# Patient Record
Sex: Male | Born: 1985 | Race: White | Hispanic: No | Marital: Single | Smoking: Current every day smoker
Health system: Southern US, Community
[De-identification: ages and names within clinical notes are randomized; demographics above are authoritative.]

---

## 2006-01-18 ENCOUNTER — Emergency Department (HOSPITAL_COMMUNITY): Admission: EM | Admit: 2006-01-18 | Discharge: 2006-01-18 | Payer: Self-pay | Admitting: Emergency Medicine

## 2007-01-22 IMAGING — CR DG PELVIS 1-2V
1 series · 1 of 1 positions shown · non-contrast
Comparison: none

CLINICAL DATA: 19 year-old who fell on right hip.
 AP PELVIS, ONE VIEW:

[t pelvis a.p.]
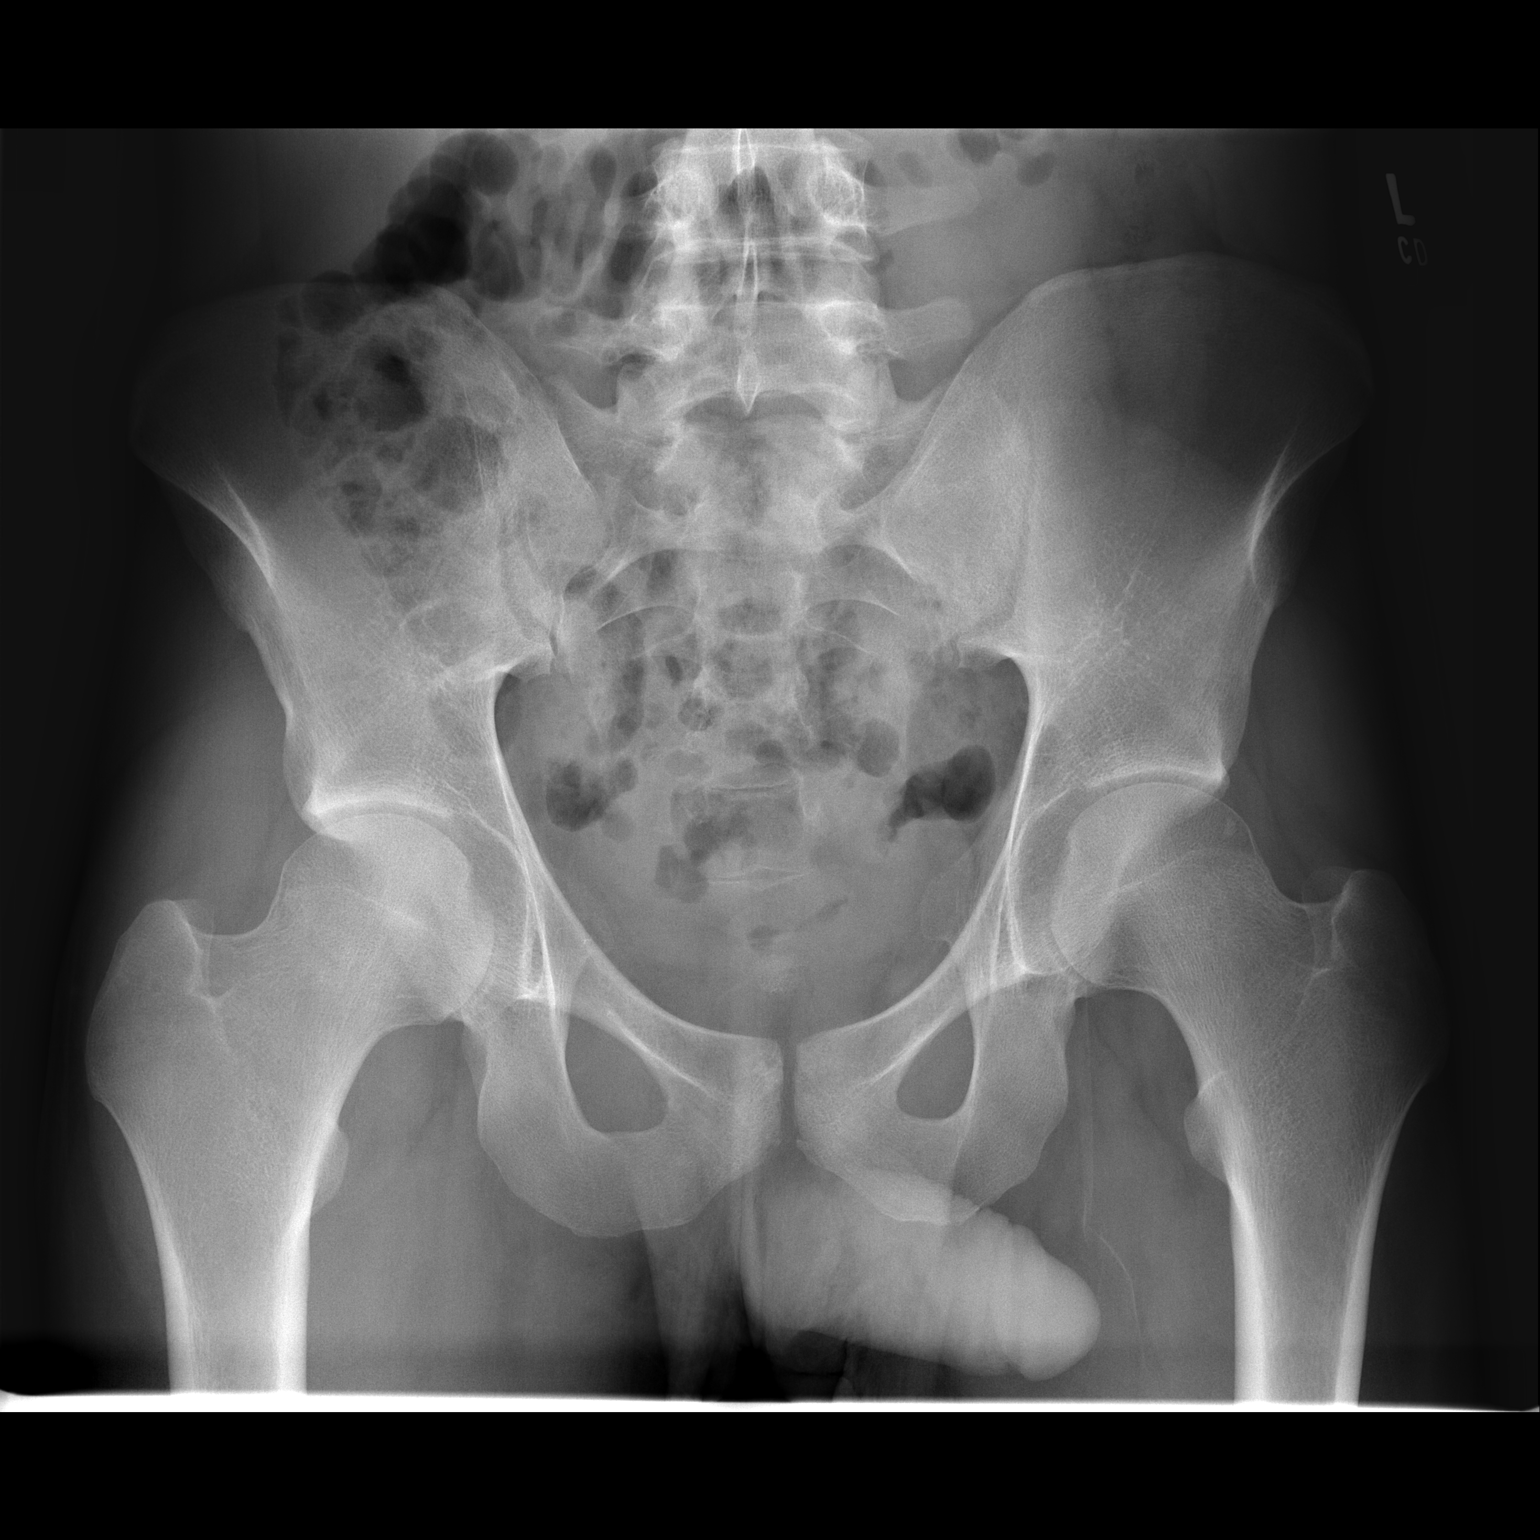

[1 of 1 positions shown; findings below may reference images not displayed]

FINDINGS: A single AP view of the pelvis demonstrates both hips to be located.  No fractures are seen.  Pubic symphysis and SI joints are intact.  No pubic rami fractures are seen.  Sacrum appears normal.
IMPRESSION: No acute bony findings.
 RIGHT HIP ? 2 VIEW:
FINDINGS: Hip joint spaces maintained.  No fractures are seen.
IMPRESSION: No acute bony findings.

## 2009-05-07 ENCOUNTER — Emergency Department (HOSPITAL_COMMUNITY): Admission: EM | Admit: 2009-05-07 | Discharge: 2009-05-07 | Payer: Self-pay | Admitting: Emergency Medicine

## 2019-01-23 ENCOUNTER — Encounter (HOSPITAL_COMMUNITY): Payer: Self-pay | Admitting: Emergency Medicine

## 2019-01-23 ENCOUNTER — Ambulatory Visit (HOSPITAL_COMMUNITY)
Admission: EM | Admit: 2019-01-23 | Discharge: 2019-01-23 | Disposition: A | Discharge status: Home / Self Care | Payer: Self-pay | Attending: Family Medicine | Admitting: Family Medicine

## 2019-01-23 DIAGNOSIS — J020 Streptococcal pharyngitis: Secondary | ICD-10-CM

## 2019-01-23 LAB — POCT RAPID STREP A: Streptococcus, Group A Screen (Direct): POSITIVE — AB

## 2019-01-23 MED ORDER — AMOXICILLIN 500 MG PO CAPS: 1000.0000 mg | ORAL_CAPSULE | Freq: Every day | ORAL | 0 refills | Status: AC

## 2019-01-23 NOTE — ED Triage Notes (Signed)
PT C/O: cold like sx onset yest associated w/fever, sore throat, dysphagia  DENIES: v/n/d  TAKING MEDS: OTC chloraseptic spray   A&O x4... NAD... Ambulatory

## 2019-01-23 NOTE — Discharge Instructions (Addendum)
Strep test was positive Treating with amoxicillin Tylenol or ibuprofen for the pain.  Follow up as needed for continued or worsening symptoms

## 2019-01-24 NOTE — ED Provider Notes (Signed)
[facility]    CSN: [id number] Arrival date & time: 01/23/19  1438     History   Chief Complaint Chief Complaint  Patient presents with  . URI    HPI Leonard Adkins is a 33 y.o. male.   Pt is a 33 year old male that presents with chief complaint of sore throat.  Symptoms started with sudden onset yesterday to include severe sore throat, fever, body aches, trouble swallowing, loss of appetite.  Symptoms have been constant and remain the same.  He has been using over-the-counter Chloraseptic spray without much relief of symptoms.  Denies any cough, congestion, rhinorrhea.  Denies any recent sick contacts.  Denies any recent traveling.  ROS per HPI      History reviewed. No pertinent past medical history.  There are no active problems to display for this patient.   History reviewed. No pertinent surgical history.     Home Medications    Prior to Admission medications   Medication Sig Start Date End Date Taking? Authorizing Provider  amoxicillin (AMOXIL) 500 MG capsule Take 2 capsules (1,000 mg total) by mouth daily for 10 days. 01/23/19 02/02/19  Janace Aris, NP    Family History History reviewed. No pertinent family history.  Social History Social History   Tobacco Use  . Smoking status: Current Every Day Smoker    Packs/day: 0.50    Types: Cigarettes  . Smokeless tobacco: Never Used  Substance Use Topics  . Alcohol use: Not on file  . Drug use: Not on file     Allergies   Patient has no known allergies.   Review of Systems Review of Systems   Physical Exam Triage Vital Signs ED Triage Vitals  Enc Vitals Group     BP 01/23/19 1533 (!) 115/57     Pulse Rate 01/23/19 1533 89     Resp 01/23/19 1533 18     Temp 01/23/19 1533 100.1 F (37.8 C)     Temp Source 01/23/19 1533 Temporal     SpO2 01/23/19 1533 99 %     Weight --      Height --      Head Circumference --      Peak Flow --      Pain Score 01/23/19 1534 5     Pain Loc  --      Pain Edu? --      Excl. in GC? --    No data found.  Updated Vital Signs BP (!) 115/57 (BP Location: Right Arm)   Pulse 89   Temp 100.1 F (37.8 C) (Temporal)   Resp 18   SpO2 99%   Visual Acuity Right Eye Distance:   Left Eye Distance:   Bilateral Distance:    Right Eye Near:   Left Eye Near:    Bilateral Near:     Physical Exam Vitals signs and nursing note reviewed.  Constitutional:      General: He is not in acute distress.    Appearance: Normal appearance. He is not toxic-appearing.  HENT:     Head: Normocephalic and atraumatic.     Right Ear: Tympanic membrane and ear canal normal.     Left Ear: Tympanic membrane and ear canal normal.     Nose: Nose normal.     Mouth/Throat:     Pharynx: Posterior oropharyngeal erythema present.     Tonsils: Tonsillar exudate present. Swelling: 2+ on the right. 2+ on the left.  Neck:  Musculoskeletal: Normal range of motion.  Cardiovascular:     Rate and Rhythm: Normal rate and regular rhythm.     Pulses: Normal pulses.     Heart sounds: Normal heart sounds.  Pulmonary:     Effort: Pulmonary effort is normal.     Breath sounds: Normal breath sounds.  Musculoskeletal: Normal range of motion.  Lymphadenopathy:     Cervical: Cervical adenopathy present.  Skin:    General: Skin is warm and dry.  Neurological:     Mental Status: He is alert.  Psychiatric:        Mood and Affect: Mood normal.      UC Treatments / Results  Labs (all labs ordered are listed, but only abnormal results are displayed) Labs Reviewed  POCT RAPID STREP A - Abnormal; Notable for the following components:      Result Value   Streptococcus, Group A Screen (Direct) POSITIVE (*)    All other components within normal limits    EKG None  Radiology No results found.  Procedures Procedures (including critical care time)  Medications Ordered in UC Medications - No data to display  Initial Impression / Assessment and Plan / UC  Course  I have reviewed the triage vital signs and the nursing notes.  Pertinent labs & imaging results that were available during my care of the patient were reviewed by me and considered in my medical decision making (see chart for details).     Rapid strep test positive Treated with amoxicillin daily for the next 10 days Tylenol/ibuprofen for fever, pain for body aches Follow up as needed for continued or worsening symptoms  Final Clinical Impressions(s) / UC Diagnoses   Final diagnoses:  Strep pharyngitis     Discharge Instructions     Strep test was positive Treating with amoxicillin Tylenol or ibuprofen for the pain.  Follow up as needed for continued or worsening symptoms      ED Prescriptions    Medication Sig Dispense Auth. Provider   amoxicillin (AMOXIL) 500 MG capsule Take 2 capsules (1,000 mg total) by mouth daily for 10 days. 20 capsule Dahlia Byes A, NP     Controlled Substance Prescriptions [place] Controlled Substance Registry consulted? Not Applicable   Janace Aris, NP 01/27/19 1114

## 2019-10-16 ENCOUNTER — Other Ambulatory Visit: Payer: Self-pay

## 2019-10-16 ENCOUNTER — Telehealth (INDEPENDENT_AMBULATORY_CARE_PROVIDER_SITE_OTHER): Payer: BC Managed Care – PPO | Admitting: Adult Health Nurse Practitioner

## 2019-10-16 ENCOUNTER — Encounter: Payer: Self-pay | Admitting: Adult Health Nurse Practitioner

## 2019-10-16 DIAGNOSIS — F411 Generalized anxiety disorder: Secondary | ICD-10-CM

## 2019-10-16 DIAGNOSIS — F4329 Adjustment disorder with other symptoms: Secondary | ICD-10-CM | POA: Diagnosis not present

## 2019-10-16 DIAGNOSIS — F4321 Adjustment disorder with depressed mood: Secondary | ICD-10-CM | POA: Diagnosis not present

## 2019-10-16 DIAGNOSIS — F322 Major depressive disorder, single episode, severe without psychotic features: Secondary | ICD-10-CM | POA: Diagnosis not present

## 2019-10-16 HISTORY — DX: Generalized anxiety disorder: F41.1

## 2019-10-16 MED ORDER — PAROXETINE HCL 30 MG PO TABS: 30.0000 mg | ORAL_TABLET | Freq: Every day | ORAL | 3 refills | Status: AC

## 2019-10-16 MED ORDER — CLONAZEPAM 1 MG PO TABS: 1.0000 mg | ORAL_TABLET | Freq: Two times a day (BID) | ORAL | 2 refills | Status: AC | PRN

## 2019-10-16 MED ORDER — TRAZODONE HCL 50 MG PO TABS: 25.0000 mg | ORAL_TABLET | Freq: Every evening | ORAL | 3 refills | Status: AC | PRN

## 2019-10-16 NOTE — Progress Notes (Signed)
Telemedicine Encounter- SOAP NOTE Established Patient  This telephone encounter was conducted with the patient's (or proxy's) verbal consent via audio telecommunications: yes/no: Yes Patient was instructed to have this encounter in a suitably private space; and to only have persons present to whom they give permission to participate. In addition, patient identity was confirmed by use of name plus two identifiers (DOB and address).  I discussed the limitations, risks, security and privacy concerns of performing an evaluation and management service by telephone and the availability of in person appointments. I also discussed with the patient that there may be a patient responsible charge related to this service. The patient expressed understanding and agreed to proceed.  I spent a total of TIME; 0 MIN TO 60 MIN: 25 minutes talking with the patient or their proxy.  Chief Complaint  Patient presents with   Anxiety    pt stated ---been emotional lately, anxious, panic attack at work which several days ago.Depression score #22, Anxiety Score #21.   ADHD    Subjective   Leonard Adkins is a 33 y.o. established patient. Telephone visit today for  Severe anxiety and depression   HPI Patient reports that he is having increasing anxiety, depression, and decreased levels of concentration.  He is having both obsessive and ruminating thoughts.  He lost his best friend out of the blue this past July and has been grieving for him.  In addition to that financial problems plus lack of work plus generalized anxiety about the future have him in a really dark place he reports.  He thinks that earlier on in the summer he was having some passive thoughts of suicide but has no intention or plan currently.  His dad is a healthcare provider in [place] and recently put him on an antipsychotic which she feels like increased his anxiety.  He is not sleeping well and when he does sleep he has very disturbing  nightmares.  Feels hopeless, worthless, and knows that he needs some help.  He does live with a roommate.  He does have support in his life.  Currently isolating more at home and not reaching out.  Denies hurting himself.  Denies taking risky behaviors.  Feels that he is unable to get out of this hole that he is experiencing  He does not drink or do any drugs.  Non-smoker.  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  Often teary and slow in responses.  Affect is low.  Psychological ROS: positive for - anxiety, concentration difficulties, depression, obsessive thoughts and sleep disturbances negative for - disorientation, hallucinations or suicidal ideation   Objective    Constituional:  Speech is slow, teary talking but conversant with linear speech and response.  Mental Status: alert, oriented to person, place, and time, depressed mood, affect appropriate to mood.   A/P  Meds ordered this encounter  Medications   PARoxetine (PAXIL) 30 MG tablet    Sig: Take 1 tablet (30 mg total) by mouth daily.    Dispense:  30 tablet    Refill:  3   clonazePAM (KLONOPIN) 1 MG tablet    Sig: Take 1 tablet (1 mg total) by mouth 2 (two) times daily as needed for anxiety.    Dispense:  20 tablet    Refill:  2   traZODone (DESYREL) 50 MG tablet    Sig: Take 0.5-1 tablets (25-50 mg total)  by mouth at bedtime as needed for sleep.    Dispense:  30 tablet    Refill:  3     Orders Placed This Encounter  Procedures   Ambulatory referral to Psychology    Referral Priority:   Urgent    Referral Type:   Psychiatric    Referral Reason:   Specialty Services Required    Requested Specialty:   Psychology    Number of Visits Requested:   1    I've explained to him that drugs of the SSRI class can have side effects such as weight gain, sexual dysfunction, insomnia, headache, nausea. These medications are generally effective at  alleviating symptoms of anxiety and/or depression. Let me know if significant side effects do occur.    Telemedicine from 10/16/2019 in Primary Care at [facility] Total Score  22      Patient is severely depressed and is going through a grief reaction of great magnitude losing his best friend this summer.  In addition he is experiencing the stress of Covid which has financial concerns and other concerns regarding his future.  He is very depressed yet experiencing his depression oftentimes in panic attacks and difficulty with sleeping.  I have explained each medication I have given to him and the directions on how to take.  Klonopin is only for his panic attacks.  Trazodone should be taken 30 minutes prior to sleep and have 6 to 8 hours to sleep.  I advised that he does need a counselor to work through his complicated grief.  He is amenable to that.  I strongly recommended that he go to the emergency room if he started feeling any urge to hurt himself or others.  Also, to reach out to any friends and/or roommates he has currently to make sure that he has someone who is monitoring him.  He verbalized understanding and we will follow-up in 4 weeks.  He is in line with this plan.  I discussed the assessment and treatment plan with the patient. The patient was provided an opportunity to ask questions and all were answered. The patient agreed with the plan and demonstrated an understanding of the instructions.   The patient was advised to call back or seek an in-person evaluation if the symptoms worsen or if the condition fails to improve as anticipated.  I provided  35 minutes of non-face-to-face time during this encounter.  A total of 30 minutes were spent face-to-face with the patient during this encounter and over half of that time was spent on counseling and coordination of care.   Glyn Ade, NP  Primary Care at [facility]

## 2019-10-21 ENCOUNTER — Encounter: Payer: Self-pay | Admitting: Adult Health Nurse Practitioner

## 2019-10-21 DIAGNOSIS — F4321 Adjustment disorder with depressed mood: Secondary | ICD-10-CM

## 2019-10-21 DIAGNOSIS — F322 Major depressive disorder, single episode, severe without psychotic features: Secondary | ICD-10-CM

## 2019-10-21 HISTORY — DX: Adjustment disorder with depressed mood: F43.21

## 2019-10-21 HISTORY — DX: Major depressive disorder, single episode, severe without psychotic features: F32.2

## 2019-11-13 ENCOUNTER — Encounter: Payer: Self-pay | Admitting: Adult Health Nurse Practitioner

## 2019-11-13 ENCOUNTER — Other Ambulatory Visit: Payer: Self-pay

## 2019-11-13 ENCOUNTER — Telehealth (INDEPENDENT_AMBULATORY_CARE_PROVIDER_SITE_OTHER): Payer: BC Managed Care – PPO | Admitting: Adult Health Nurse Practitioner

## 2019-11-13 VITALS — Ht 72.0 in | Wt 150.0 lb

## 2019-11-13 DIAGNOSIS — F322 Major depressive disorder, single episode, severe without psychotic features: Secondary | ICD-10-CM

## 2019-11-13 DIAGNOSIS — F411 Generalized anxiety disorder: Secondary | ICD-10-CM

## 2019-11-13 DIAGNOSIS — F4321 Adjustment disorder with depressed mood: Secondary | ICD-10-CM

## 2019-11-13 NOTE — Progress Notes (Signed)
Telemedicine Encounter- SOAP NOTE Established Patient  This telephone encounter was conducted with the patient's (or proxy's) verbal consent via audio telecommunications: yes/no: Yes Patient was instructed to have this encounter in a suitably private space; and to only have persons present to whom they give permission to participate. In addition, patient identity was confirmed by use of name plus two identifiers (DOB and address).  I discussed the limitations, risks, security and privacy concerns of performing an evaluation and management service by telephone and the availability of in person appointments. I also discussed with the patient that there may be a patient responsible charge related to this service. The patient expressed understanding and agreed to proceed.  I spent a total of TIME; 0 MIN TO 60 MIN: 20 minutes talking with the patient or their proxy.  Chief Complaint  Patient presents with  . Follow-up    pt stated-- do not think wanted to take the Rx Paxil--information from family and friends. PHQ=7, GAD=15    Subjective   Leonard Adkins is a 33 y.o. established patient. Telephone visit today for f/u anxiety   HPI   Reviewed anxiety and depression.  He feels he is in a better state than prior.  No suicide ideation.  No dark thoughts.  He does feel that his anxiety has been persistent for many years and aggravated now with his grief and with the financial strain caused by working less due to [employer].  He did not take the Paxil.  His mom, previously had been on medications and other experiences have made him wary to try an SsRI on a regular basis.  Has not found a therapist yet but is actively looking.    Patient Active Problem List   Diagnosis Date Noted  . Grief 10/21/2019  . Major depressive disorder, single episode, severe ([place]) 10/21/2019  . Anxiety state 10/16/2019    Past Medical History:  Diagnosis Date  . Anxiety state 10/16/2019  . Grief 10/21/2019  . Major  depressive disorder, single episode, severe ([place]) 10/21/2019    Current Outpatient Medications  Medication Sig Dispense Refill  . clonazePAM (KLONOPIN) 1 MG tablet Take 1 tablet (1 mg total) by mouth 2 (two) times daily as needed for anxiety. 20 tablet 2  . traZODone (DESYREL) 50 MG tablet Take 0.5-1 tablets (25-50 mg total) by mouth at bedtime as needed for sleep. 30 tablet 3  . PARoxetine (PAXIL) 30 MG tablet Take 1 tablet (30 mg total) by mouth daily. (Patient not taking: Reported on 11/13/2019) 30 tablet 3   No current facility-administered medications for this visit.    No Known Allergies  Social History   Socioeconomic History  . Marital status: Married    Spouse name: Not on file  . Number of children: Not on file  . Years of education: Not on file  . Highest education level: Not on file  Occupational History  . Not on file  Tobacco Use  . Smoking status: Current Every Day Smoker    Packs/day: 0.50    Types: Cigarettes  . Smokeless tobacco: Never Used  Substance and Sexual Activity  . Alcohol use: Never  . Drug use: Yes    Types: Marijuana    Comment: occ  . Sexual activity: Not on file  Other Topics Concern  . Not on file  Social History Narrative  . Not on file   Social Determinants of Health   Financial Resource Strain:   . Difficulty of Paying Living Expenses:  Not on file  Food Insecurity:   . Worried About Programme researcher, broadcasting/film/video in the Last Year: Not on file  . Ran Out of Food in the Last Year: Not on file  Transportation Needs:   . Lack of Transportation (Medical): Not on file  . Lack of Transportation (Non-Medical): Not on file  Physical Activity:   . Days of Exercise per Week: Not on file  . Minutes of Exercise per Session: Not on file  Stress:   . Feeling of Stress : Not on file  Social Connections:   . Frequency of Communication with Friends and Family: Not on file  . Frequency of Social Gatherings with Friends and Family: Not on file  . Attends  Religious Services: Not on file  . Active Member of Clubs or Organizations: Not on file  . Attends Banker Meetings: Not on file  . Marital Status: Not on file  Intimate Partner Violence:   . Fear of Current or Ex-Partner: Not on file  . Emotionally Abused: Not on file  . Physically Abused: Not on file  . Sexually Abused: Not on file    ROS   Review of Systems See HPI Constitution: No fevers or chills No malaise No diaphoresis Skin: No rash or itching Eyes: no blurry vision, no double vision GU: no dysuria or hematuria Neuro: no dizziness or headaches Psych:  + for depression, anxiety, grief.   Objective    General appearance: oriented to person, place, and time. Mental Status: normal mood, behavior, speech, dress, motor activity, and thought processes, affect appropriate to mood.   Vitals as reported by the patient: Today's Vitals   11/13/19 1102  Weight: 150 lb (68 kg)  Height: 6' (1.829 m)    Keenen was seen today for follow-up.  Diagnoses and all orders for this visit:  Anxiety state  Grief  Major depressive disorder, single episode, severe ([facility])    Would like to continue Trazodone and Klonopin. Will wait on SSRI.  Will check in again in 2 months.  Recommended the patient to call or contact if he wanted to start SSRI sooner rather than later.  He is inline with that plan.  Will f/u in 2 months    I discussed the assessment and treatment plan with the patient. The patient was provided an opportunity to ask questions and all were answered. The patient agreed with the plan and demonstrated an understanding of the instructions.   The patient was advised to call back or seek an in-person evaluation if the symptoms worsen or if the condition fails to improve as anticipated.  I provided 20 minutes of non-face-to-face time during this encounter.  Elyse Jarvis, NP  Primary Care at [facility]

## 2020-04-16 DIAGNOSIS — Z20828 Contact with and (suspected) exposure to other viral communicable diseases: Secondary | ICD-10-CM | POA: Diagnosis not present

## 2020-04-16 DIAGNOSIS — Z03818 Encounter for observation for suspected exposure to other biological agents ruled out: Secondary | ICD-10-CM | POA: Diagnosis not present

## 2020-04-18 DIAGNOSIS — J069 Acute upper respiratory infection, unspecified: Secondary | ICD-10-CM | POA: Diagnosis not present

## 2022-08-19 ENCOUNTER — Emergency Department (HOSPITAL_BASED_OUTPATIENT_CLINIC_OR_DEPARTMENT_OTHER): Payer: BC Managed Care – PPO | Admitting: Radiology

## 2022-08-19 ENCOUNTER — Other Ambulatory Visit: Payer: Self-pay

## 2022-08-19 ENCOUNTER — Emergency Department (HOSPITAL_BASED_OUTPATIENT_CLINIC_OR_DEPARTMENT_OTHER)
Admission: EM | Admit: 2022-08-19 | Discharge: 2022-08-19 | Disposition: A | Discharge status: Home / Self Care | Payer: BC Managed Care – PPO | Attending: Emergency Medicine | Admitting: Emergency Medicine

## 2022-08-19 DIAGNOSIS — S060X0A Concussion without loss of consciousness, initial encounter: Secondary | ICD-10-CM | POA: Insufficient documentation

## 2022-08-19 DIAGNOSIS — W19XXXA Unspecified fall, initial encounter: Secondary | ICD-10-CM

## 2022-08-19 DIAGNOSIS — S0990XA Unspecified injury of head, initial encounter: Secondary | ICD-10-CM | POA: Diagnosis present

## 2022-08-19 DIAGNOSIS — M25512 Pain in left shoulder: Secondary | ICD-10-CM | POA: Insufficient documentation

## 2022-08-19 DIAGNOSIS — W01198A Fall on same level from slipping, tripping and stumbling with subsequent striking against other object, initial encounter: Secondary | ICD-10-CM | POA: Insufficient documentation

## 2022-08-19 MED ORDER — OXYCODONE-ACETAMINOPHEN 5-325 MG PO TABS
1.0000 | ORAL_TABLET | ORAL | Status: AC | PRN
  Administered 2022-08-19 (×2): 1 via ORAL
  Filled 2022-08-19 (×2): qty 1

## 2022-08-19 MED ORDER — HYDROCODONE-ACETAMINOPHEN 5-325 MG PO TABS: 1.0000 | ORAL_TABLET | Freq: Four times a day (QID) | ORAL | 0 refills | Status: DC | PRN

## 2022-08-19 MED ORDER — HYDROCODONE-ACETAMINOPHEN 5-325 MG PO TABS: 1.0000 | ORAL_TABLET | Freq: Four times a day (QID) | ORAL | 0 refills | Status: AC | PRN

## 2022-08-19 NOTE — ED Notes (Signed)
Pain 8-10. Requesting Pain meds. PRN med given.

## 2022-08-19 NOTE — ED Provider Notes (Signed)
[facility] EMERGENCY DEPT Provider Note   CSN: [id number] Arrival date & time: 08/19/22  1553     History  Chief Complaint  Patient presents with   Leonard Adkins is a 36 y.o. male with Hx of anxiety, depression, daily tobacco use, and recreational drug use.  Presents with multiple bodily pains following a fall around noon today.  Dog was running around the house, and patient was knocked to the ground.  Fell onto his left shoulder and hit the back left side of his head.  Denies LOC.  Felt instant nausea and panic, however this quickly relieved.  Has been without nausea or vomiting since.  Mostly concerned of the pain in the left shoulder.  Initially felt tingling down the left arm, however denies active tingling or decreased sensation.  Reports extreme discomfort moving his left arm around, concerned of possible dislocation or fracture.  Denies vision changes, neck stiffness, shortness of breath, weakness, or chest pain.  Provided one dose of percocet in triage.  The history is provided by the patient and medical records.  Fall     Home Medications Prior to Admission medications   Medication Sig Start Date End Date Taking? Authorizing Provider  HYDROcodone-acetaminophen (NORCO/VICODIN) 5-325 MG tablet Take 1 tablet by mouth every 6 (six) hours as needed for moderate pain. 08/19/22  Yes Prince Rome, PA-C  clonazePAM (KLONOPIN) 1 MG tablet Take 1 tablet (1 mg total) by mouth 2 (two) times daily as needed for anxiety. 10/16/19   Wendall Mola, NP  PARoxetine (PAXIL) 30 MG tablet Take 1 tablet (30 mg total) by mouth daily. Patient not taking: Reported on 11/13/2019 10/16/19 11/15/19  Wendall Mola, NP  traZODone (DESYREL) 50 MG tablet Take 0.5-1 tablets (25-50 mg total) by mouth at bedtime as needed for sleep. 10/16/19   Wendall Mola, NP      Allergies    Patient has no known allergies.    Review of Systems   Review of Systems   Musculoskeletal:  Positive for joint swelling.       Left shoulder pain    Physical Exam Updated Vital Signs Ht 6\' 1"  (1.854 m)   Wt 77.1 kg   BMI 22.43 kg/m  Physical Exam Vitals and nursing note reviewed.  Constitutional:      General: He is not in acute distress.    Appearance: He is well-developed. He is not ill-appearing, toxic-appearing or diaphoretic.  HENT:     Head: Normocephalic and atraumatic.     Mouth/Throat:     Mouth: Mucous membranes are moist.     Pharynx: Oropharynx is clear.  Eyes:     General: Lids are normal. Gaze aligned appropriately.     Extraocular Movements: Extraocular movements intact.     Conjunctiva/sclera: Conjunctivae normal.     Pupils: Pupils are equal, round, and reactive to light.     Visual Fields: Right eye visual fields normal and left eye visual fields normal.  Neck:     Comments: No meningismus or torticollis, very supple on exam.  No midline C-spine tenderness Cardiovascular:     Rate and Rhythm: Normal rate and regular rhythm.     Pulses: Normal pulses.     Heart sounds: Normal heart sounds. No murmur heard.    Comments: Radial pulses 2+ bilaterally.   Pulmonary:     Effort: Pulmonary effort is normal. No respiratory distress.     Breath sounds: Normal breath sounds. No  stridor. No wheezing.     Comments: CTAB, able to communicate without difficulty, without increased respiratory effort Chest:     Chest wall: No tenderness.  Abdominal:     Palpations: Abdomen is soft.     Tenderness: There is no abdominal tenderness.  Musculoskeletal:        General: Tenderness, deformity and signs of injury present.     Cervical back: Normal range of motion and neck supple. No rigidity or tenderness.     Comments: Left upper extremity: Soft tissue deformity superior to the left scapula, with exquisite tenderness on palpation.  ROM of left shoulder deferred due to possibility of fracture or dislocation.  Though no AC joint tenderness or humeral  head tenderness.  Elbow, wrist, and digits appear neurovascularly intact with retained ROM.   Without midline thoracic tenderness or tenderness along the rib cage.  Skin:    General: Skin is warm and dry.     Capillary Refill: Capillary refill takes less than 2 seconds.     Coloration: Skin is not jaundiced or pale.  Neurological:     Mental Status: He is alert and oriented to person, place, and time.     GCS: GCS eye subscore is 4. GCS verbal subscore is 5. GCS motor subscore is 6.     Cranial Nerves: No dysarthria or facial asymmetry.     Motor: No tremor.  Psychiatric:        Mood and Affect: Mood normal.     ED Results / Procedures / Treatments   Labs (all labs ordered are listed, but only abnormal results are displayed) Labs Reviewed - No data to display  EKG None  Radiology DG Shoulder Left  Result Date: 08/19/2022 CLINICAL DATA:  Left shoulder pain after fall EXAM: LEFT SHOULDER - 2+ VIEW COMPARISON:  None Available. FINDINGS: No fracture. No glenohumeral dislocation. No evidence of acromioclavicular separation. No significant arthropathy. No suspicious focal osseous lesions. No radiopaque foreign bodies or pathologic soft tissue calcifications. IMPRESSION: No left shoulder fracture or malalignment. Electronically Signed   By: Ilona Sorrel M.D.   On: 08/19/2022 17:36    Procedures Procedures    Medications Ordered in ED Medications  oxyCODONE-acetaminophen (PERCOCET/ROXICET) 5-325 MG per tablet 1 tablet (1 tablet Oral Given 08/19/22 1825)    ED Course/ Medical Decision Making/ A&P                           Medical Decision Making Amount and/or Complexity of Data Reviewed Radiology: ordered.  Risk Prescription drug management.   36 y.o. male presents to the ED for concern of Fall   This involves an extensive number of treatment options, and is a complaint that carries with it a high risk of complications and morbidity.  The emergent differential diagnosis prior  to evaluation includes, but is not limited to: Fracture, dislocation, contusion, sprain  This is not an exhaustive differential.   Past Medical History / Co-morbidities / Social History: Hx of anxiety, depression, daily tobacco use, and recreational drug use Social Determinants of Health include: Tobacco use, and recreational drug use, for which cessation counseling was provided  Additional History:  None  Lab Tests: None  Imaging Studies: I ordered imaging studies including XR left shoulder.   I independently visualized and interpreted XR imaging which showed no evidence of acute fracture or dislocation.   I agree with the radiologist interpretation.  ED Course: Pt well-appearing on exam.  Presenting  today with acute left shoulder pain and mild left posterior head pain from fall a few hours ago.   Without LOC, vomiting, delirium, or vision changes.  PERRLA.  Normal EOMs.  Without clinical evidence of disequilibrium.  Without shortness of breath, presyncopal sensation, or dizziness preceding or following the fall.  Mild nausea initial fall, however resolved quickly and accompanied with anxiousness at that time.  Low suspicion for ICH.  Clinical presentation of acute concussion.  Without persistent headache since the initial trauma.  No evidence of skull fracture or hematoma on physical exam.  Not taking anticoagulants, is less than 65 and has no hx of subarachnoid or subdural hemorrhage.  Patient denies N/V, amnesia, vision changes, cognitive or memory dysfunction and vertigo.  Patient with no focal neurological deficits on exam as described above.  Discussed the likely etiology of patient's symptoms being concussive in nature.  Discussed the risk versus benefit of CT scan at this time I do not believe this is warranted at this time. Patient agrees that CT is not indicated at this time.  Discussed thoroughly symptoms to return to the emergency department including severe headaches,  disequilibrium, vomiting, double vision, extremity weakness, difficulty ambulating, or any other concerning symptoms.  Plan for discharge with information pertaining to diagnosis.  Recommended rest and use of OTC medications like NSAIDs and Tylenol for pain relief.  Advised to not participate in contact sports or vigorous activities until they are completely asymptomatic for at least 1 week or they are cleared by their primary doctor.  Also recommended following up with Neurology for post-concussive syndrome evaluation should symptoms persist longer than one month -- resources provided.  Soft tissue deformity and with exquisite tenderness superior to the left scapula on exam.  No bony tenderness or evidence of dislocation or fracture on exam.  Upper extremities appear neurovascularly intact.  Patient X-Ray negative for obvious fracture or dislocation.  Pain managed in ED.  Pt advised to follow up with orthopedics for further evaluation for possible soft tissue injury.  Pt given shoulder immobilizer while in ED.  Extremity rechecked and appears to remain neurovascularly intact after application.  Conservative therapy recommended and discussed.  Patient in NAD and in good condition at time of discharge.  Disposition: After consideration the patient's encounter today, I do not feel today's workup suggests an emergent condition requiring admission or immediate intervention beyond what has been performed at this time.  Safe for discharge; instructed to return immediately for worsening symptoms, change in symptoms or any other concerns.  I have reviewed the patients home medicines and have made adjustments as needed.  Discussed course of treatment with the patient, whom demonstrated understanding.  Patient in agreement and has no further questions.    I discussed this case with my attending physician Dr. Johnney Killian, who agreed with the proposed treatment course and cosigned this note including patient's presenting  symptoms, physical exam, and planned diagnostics and interventions.  Attending physician stated agreement with plan or made changes to plan which were implemented.     This chart was dictated using voice recognition software.  Despite best efforts to proofread, errors can occur which can change the documentation meaning.         Final Clinical Impression(s) / ED Diagnoses Final diagnoses:  Fall, initial encounter  Concussion without loss of consciousness  Acute pain of left shoulder    Rx / DC Orders ED Discharge Orders          Ordered  Ambulatory referral to Neurology       Comments: An appointment is requested in approximately: 4 weeks for postconcussive syndrome evaluation   08/19/22 1800    HYDROcodone-acetaminophen (NORCO/VICODIN) 5-325 MG tablet  Every 6 hours PRN        08/19/22 1802              Prince Rome, PA-C 84/78/41 1410    Charlesetta Shanks, MD 08/27/22 1754

## 2022-08-19 NOTE — Discharge Instructions (Addendum)
You have been provided the contact information for Dr. Ginette Pitman, a local orthopedic specialist.  Please call first thing Monday to schedule a follow-up within the next 2 to 3 days for reevaluation and continued medical management.  I have sent a short course of pain medication to the pharmacy, you may take 1 tablet every 8 hours as needed for pain.  Keep in mind this medication also has Tylenol, do not exceed the maximum daily amount of Tylenol (4000 mg/day, or 1000 mg every 6 hours).  Continue to rest, support, and ice your injury.  And return to the ED for any new or worsening symptoms as discussed.  You have also been provided the contact information for a local neurology office.  You may schedule a follow-up appoint with them if your concussion-like symptoms have remained constant for 3 to 4 weeks or more so that you may be evaluated for postconcussive syndrome.  This is uncommon, however it may sometimes occur following a concussion.  Further information on concussions has been provided for you to review.

## 2022-08-19 NOTE — ED Triage Notes (Signed)
Reported mechanical fall due to wet floor in the kitchen from a standing position down on his left side; + head injury; -LOC; -thinners. Stated unable to get up for a few mins due to pain.

## 2023-06-19 DIAGNOSIS — L989 Disorder of the skin and subcutaneous tissue, unspecified: Secondary | ICD-10-CM | POA: Diagnosis not present

## 2024-03-04 DIAGNOSIS — J020 Streptococcal pharyngitis: Secondary | ICD-10-CM | POA: Diagnosis not present

## 2024-03-04 DIAGNOSIS — J029 Acute pharyngitis, unspecified: Secondary | ICD-10-CM | POA: Diagnosis not present
